# Patient Record
Sex: Female | Born: 1945 | Race: White | Hispanic: No | Marital: Married | Smoking: Never smoker
Health system: Southern US, Community
[De-identification: ages and names within clinical notes are randomized; demographics above are authoritative.]

## PROBLEM LIST (undated history)

## (undated) DIAGNOSIS — I10 Essential (primary) hypertension: Secondary | ICD-10-CM

## (undated) DIAGNOSIS — J302 Other seasonal allergic rhinitis: Secondary | ICD-10-CM

## (undated) DIAGNOSIS — M5430 Sciatica, unspecified side: Secondary | ICD-10-CM

## (undated) HISTORY — PX: TONSILLECTOMY: SUR1361

---

## 2000-05-18 ENCOUNTER — Encounter: Admission: RE | Admit: 2000-05-18 | Discharge: 2000-05-18 | Payer: Self-pay | Admitting: Otolaryngology

## 2000-05-18 ENCOUNTER — Encounter: Payer: Self-pay | Admitting: Otolaryngology

## 2012-12-14 ENCOUNTER — Ambulatory Visit: Payer: Self-pay | Admitting: Internal Medicine

## 2014-06-08 ENCOUNTER — Ambulatory Visit
Admit: 2014-06-08 | Disposition: A | Discharge status: Home / Self Care | Payer: Self-pay | Attending: Internal Medicine | Admitting: Internal Medicine

## 2015-04-16 ENCOUNTER — Emergency Department: Payer: Medicare Other

## 2015-04-16 ENCOUNTER — Emergency Department
Admission: EM | Admit: 2015-04-16 | Discharge: 2015-04-16 | Disposition: A | Discharge status: Home / Self Care | Payer: Medicare Other | Attending: Emergency Medicine | Admitting: Emergency Medicine

## 2015-04-16 DIAGNOSIS — Y9389 Activity, other specified: Secondary | ICD-10-CM | POA: Insufficient documentation

## 2015-04-16 DIAGNOSIS — I10 Essential (primary) hypertension: Secondary | ICD-10-CM | POA: Insufficient documentation

## 2015-04-16 DIAGNOSIS — Y998 Other external cause status: Secondary | ICD-10-CM | POA: Diagnosis not present

## 2015-04-16 DIAGNOSIS — W010XXA Fall on same level from slipping, tripping and stumbling without subsequent striking against object, initial encounter: Secondary | ICD-10-CM | POA: Diagnosis not present

## 2015-04-16 DIAGNOSIS — S42291A Other displaced fracture of upper end of right humerus, initial encounter for closed fracture: Secondary | ICD-10-CM | POA: Insufficient documentation

## 2015-04-16 DIAGNOSIS — S4991XA Unspecified injury of right shoulder and upper arm, initial encounter: Secondary | ICD-10-CM | POA: Diagnosis present

## 2015-04-16 DIAGNOSIS — Y9289 Other specified places as the place of occurrence of the external cause: Secondary | ICD-10-CM | POA: Diagnosis not present

## 2015-04-16 DIAGNOSIS — S42211A Unspecified displaced fracture of surgical neck of right humerus, initial encounter for closed fracture: Secondary | ICD-10-CM

## 2015-04-16 HISTORY — DX: Essential (primary) hypertension: I10

## 2015-04-16 MED ORDER — HYDROCODONE-ACETAMINOPHEN 5-325 MG PO TABS
1.0000 | ORAL_TABLET | Freq: Once | ORAL | Status: AC
  Administered 2015-04-16: 1 via ORAL
  Filled 2015-04-16: qty 1

## 2015-04-16 MED ORDER — HYDROCODONE-ACETAMINOPHEN 5-325 MG PO TABS: 1.0000 | ORAL_TABLET | Freq: Four times a day (QID) | ORAL | Status: AC | PRN

## 2015-04-16 NOTE — ED Notes (Signed)
Fell 2-3 hours ago, unable to move fingers afterwards.  R arm is in pain.  Pt can move but area is very tender

## 2015-04-16 NOTE — ED Provider Notes (Signed)
[facility] Emergency Department Provider Note  ____________________________________________  Time seen: Approximately 8:10 PM  I have reviewed the triage vital signs and the nursing notes.   HISTORY  Chief Complaint Arm Pain    HPI Katrina English is a 70 y.o. female , NAD, reports to the emergency department with right shoulder and upper arm pain since falling 2-3 hours ago. States she tripped over something on the floor and fell and landed on her right shoulder. Had an episode of pain shooting down into the fingers causing inability to move them but that is since resolved. Notes significant tenderness to the upper arm with inability to raise the arm. Denies any injury or trauma to the right upper extremity in the past. Not taking anything for pain. Denies headache, head injury, LOC, chest pain, shortness of breath, dizziness or visual changes.   Past Medical History  Diagnosis Date  . Hypertension     There are no active problems to display for this patient.   Past Surgical History  Procedure Laterality Date  . Tonsillectomy      Current Outpatient Rx  Name  Route  Sig  Dispense  Refill  . HYDROcodone-acetaminophen (NORCO) 5-325 MG tablet   Oral   Take 1 tablet by mouth every 6 (six) hours as needed for severe pain.   20 tablet   0     Allergies Review of patient's allergies indicates no known allergies.  No family history on file.  Social History Social History  Substance Use Topics  . Smoking status: Never Smoker   . Smokeless tobacco: None  . Alcohol Use: No     Review of Systems  Constitutional: No fatigue. Eyes: No visual changes.  Cardiovascular: No chest pain. Respiratory: No shortness of breath. No wheezing.  Gastrointestinal: No abdominal pain.  No nausea, vomiting.   Musculoskeletal: Positive right shoulder and upper arm pain. Negative for back, neck pain.  Skin: Negative for laceration, open wounds nor rash or  ecchymosis. Neurological: Negative for headaches, focal weakness or numbness or tingling. 10-point ROS otherwise negative.  ____________________________________________   PHYSICAL EXAM:  VITAL SIGNS: ED Triage Vitals  Enc Vitals Group     BP 04/16/15 2002 105/67 mmHg     Pulse Rate 04/16/15 2002 95     Resp --      Temp --      Temp src --      SpO2 04/16/15 2002 100 %     Weight 04/16/15 2002 170 lb (77.111 kg)     Height 04/16/15 2002 5' (1.524 m)     Head Cir --      Peak Flow --      Pain Score 04/16/15 2003 8     Pain Loc --      Pain Edu? --      Excl. in GC? --     Constitutional: Alert and oriented. Well appearing and in no acute distress but in pain. Eyes: Conjunctivae are normal. PERRL.  Head: Atraumatic. Neck: No cervical spine tenderness to palpation. Supple with full range of motion Hematological/Lymphatic/Immunilogical: No cervical lymphadenopathy. Cardiovascular: Good peripheral circulation. Upper extremity pulses 2+ with capillary refill less than 3 seconds Respiratory: Normal respiratory effort without tachypnea or retractions.  Musculoskeletal:  Significant tenderness to palpation over the right proximal humerus. Patient unable to abduct the right upper extremity nor abduct without pain. Full range of motion of right elbow on the wrist, fingers without pain.  Neurologic:  Normal  speech and language. No gross focal neurologic deficits are appreciated.  Skin:  Skin is warm, dry and intact. No rash noted. No ecchymosis. Psychiatric: Mood and affect are normal. Speech and behavior are normal. Patient exhibits appropriate insight and judgement.  ____________________________________________   LABS  None ____________________________________________  EKG  None ____________________________________________  RADIOLOGY I have personally viewed and evaluated these images (plain radiographs) as part of my medical decision making, as well as reviewing the  written report by the radiologist.  Dg Shoulder Right  04/16/2015  CLINICAL DATA:  Right arm pain following a fall today. EXAM: RIGHT SHOULDER - 2+ VIEW COMPARISON:  None. FINDINGS: Comminuted fracture of the right humeral neck with extension into the greater tuberosity. 1/3 shaft width of medial displacement and lateral angulation of the distal fragment. No dislocation. Old, healed right rib fractures. IMPRESSION: Comminuted right humeral neck fracture, as described above. Electronically Signed   By: Beckie Salts M.D.   On: 04/16/2015 20:37   Dg Humerus Right  04/16/2015  CLINICAL DATA:  Initial evaluation for acute trauma, fall. EXAM: RIGHT HUMERUS - 2+ VIEW COMPARISON:  None. FINDINGS: Acute transverse fracture through the neck of the right humerus with slight impaction. Probable extension through the greater tuberosity. Humeral head remains grossly aligned with the glenoid. No soft tissue abnormality. Osteopenia noted. IMPRESSION: Acute transverse fracture through the neck of the right humerus with slight impaction. Electronically Signed   By: Rise Mu M.D.   On: 04/16/2015 20:37    ____________________________________________    PROCEDURES  Procedure(s) performed: None    Medications  HYDROcodone-acetaminophen (NORCO/VICODIN) 5-325 MG per tablet 1 tablet (1 tablet Oral Given 04/16/15 2031)     ____________________________________________   INITIAL IMPRESSION / ASSESSMENT AND PLAN / ED COURSE  Pertinent imaging results that were available during my care of the patient were reviewed by me and considered in my medical decision making (see chart for details).  Patient's diagnosis is consistent with right humeral neck fracture. Patient will be discharged home with prescriptions for Norco 5-325 mg tablets to take 1 tablet every 4-6 hours as needed for pain.. Patient is to follow up with Dr. Hyacinth Meeker in orthopedics within 48 hours. She is aware to call his office tomorrow morning  to schedule an appointment for follow-up.  Patient is given ED precautions to return to the ED for any worsening or new symptoms.    ____________________________________________  FINAL CLINICAL IMPRESSION(S) / ED DIAGNOSES  Final diagnoses:  Fracture of neck of humerus, right, closed, initial encounter      NEW MEDICATIONS STARTED DURING THIS VISIT:  New Prescriptions   HYDROCODONE-ACETAMINOPHEN (NORCO) 5-325 MG TABLET    Take 1 tablet by mouth every 6 (six) hours as needed for severe pain.         Hope Pigeon, PA-C 04/16/15 2122  Myrna Blazer, MD 04/16/15 2228

## 2015-04-16 NOTE — Discharge Instructions (Signed)
Humerus Fracture Treated With Immobilization °The humerus is the large bone in your upper arm. You have a broken (fractured) humerus. These fractures are easily diagnosed with X-rays. °TREATMENT  °Simple fractures which will heal without disability are treated with simple immobilization. Immobilization means you will wear a cast, splint, or sling. You have a fracture which will do well with immobilization. The fracture will heal well simply by being held in a good position until it is stable enough to begin range of motion exercises. Do not take part in activities which would further injure your arm.  °HOME CARE INSTRUCTIONS  °· Put ice on the injured area. °¨ Put ice in a plastic bag. °¨ Place a towel between your skin and the bag. °¨ Leave the ice on for 15-20 minutes, 03-04 times a day. °· If you have a cast: °¨ Do not scratch the skin under the cast using sharp or pointed objects. °¨ Check the skin around the cast every day. You may put lotion on any red or sore areas. °¨ Keep your cast dry and clean. °· If you have a splint: °¨ Wear the splint as directed. °¨ Keep your splint dry and clean. °¨ You may loosen the elastic around the splint if your fingers become numb, tingle, or turn cold or blue. °· If you have a sling: °¨ Wear the sling as directed. °· Do not put pressure on any part of your cast or splint until it is fully hardened. °· Your cast or splint can be protected during bathing with a plastic bag. Do not lower the cast or splint into water. °· Only take over-the-counter or prescription medicines for pain, discomfort, or fever as directed by your caregiver. °· Do range of motion exercises as instructed by your caregiver. °· Follow up as directed by your caregiver. This is very important in order to avoid permanent injury or disability and chronic pain. °SEEK IMMEDIATE MEDICAL CARE IF:  °· Your skin or nails in the injured arm turn blue or gray. °· Your arm feels cold or numb. °· You develop severe pain  in the injured arm. °· You are having problems with the medicines you were given. °MAKE SURE YOU:  °· Understand these instructions. °· Will watch your condition. °· Will get help right away if you are not doing well or get worse. °  °This information is not intended to replace advice given to you by your health care provider. Make sure you discuss any questions you have with your health care provider. °  °Document Released: 06/01/2000 Document Revised: 03/16/2014 Document Reviewed: 07/18/2014 °Elsevier Interactive Patient Education ©2016 [employer] ° °

## 2015-06-12 ENCOUNTER — Other Ambulatory Visit: Payer: Self-pay | Admitting: Internal Medicine

## 2015-06-12 DIAGNOSIS — M5416 Radiculopathy, lumbar region: Secondary | ICD-10-CM

## 2015-06-29 ENCOUNTER — Emergency Department
Admission: EM | Admit: 2015-06-29 | Discharge: 2015-06-29 | Disposition: A | Discharge status: Home / Self Care | Payer: Medicare Other | Attending: Emergency Medicine | Admitting: Emergency Medicine

## 2015-06-29 ENCOUNTER — Encounter: Payer: Self-pay | Admitting: Urgent Care

## 2015-06-29 ENCOUNTER — Emergency Department: Payer: Medicare Other

## 2015-06-29 DIAGNOSIS — J302 Other seasonal allergic rhinitis: Secondary | ICD-10-CM

## 2015-06-29 DIAGNOSIS — Z79899 Other long term (current) drug therapy: Secondary | ICD-10-CM | POA: Insufficient documentation

## 2015-06-29 DIAGNOSIS — I10 Essential (primary) hypertension: Secondary | ICD-10-CM | POA: Diagnosis not present

## 2015-06-29 DIAGNOSIS — J069 Acute upper respiratory infection, unspecified: Secondary | ICD-10-CM

## 2015-06-29 DIAGNOSIS — R0602 Shortness of breath: Secondary | ICD-10-CM | POA: Diagnosis present

## 2015-06-29 HISTORY — DX: Other seasonal allergic rhinitis: J30.2

## 2015-06-29 HISTORY — DX: Sciatica, unspecified side: M54.30

## 2015-06-29 LAB — CBC
HEMATOCRIT: 37.8 % (ref 35.0–47.0)
Hemoglobin: 12.9 g/dL (ref 12.0–16.0)
MCH: 33 pg (ref 26.0–34.0)
MCHC: 34.2 g/dL (ref 32.0–36.0)
MCV: 96.6 fL (ref 80.0–100.0)
PLATELETS: 271 10*3/uL (ref 150–440)
RBC: 3.92 MIL/uL (ref 3.80–5.20)
RDW: 13.8 % (ref 11.5–14.5)
WBC: 8.5 10*3/uL (ref 3.6–11.0)

## 2015-06-29 LAB — BASIC METABOLIC PANEL
Anion gap: 9 (ref 5–15)
BUN: 21 mg/dL — AB (ref 6–20)
CALCIUM: 9.6 mg/dL (ref 8.9–10.3)
CHLORIDE: 103 mmol/L (ref 101–111)
CO2: 23 mmol/L (ref 22–32)
CREATININE: 0.82 mg/dL (ref 0.44–1.00)
GFR calc Af Amer: >60 mL/min (ref >60)
GFR calc non Af Amer: >60 mL/min (ref >60)
Glucose, Bld: 125 mg/dL — ABNORMAL HIGH (ref 65–99)
Potassium: 3.5 mmol/L (ref 3.5–5.1)
SODIUM: 135 mmol/L (ref 135–145)

## 2015-06-29 LAB — TROPONIN I

## 2015-06-29 MED ORDER — PREDNISONE 20 MG PO TABS: 40.0000 mg | ORAL_TABLET | Freq: Every day | ORAL | Status: AC

## 2015-06-29 MED ORDER — PREDNISONE 20 MG PO TABS
40.0000 mg | ORAL_TABLET | Freq: Once | ORAL | Status: AC
  Administered 2015-06-29: 40 mg via ORAL
  Filled 2015-06-29: qty 2

## 2015-06-29 MED ORDER — ALBUTEROL SULFATE HFA 108 (90 BASE) MCG/ACT IN AERS: 2.0000 | INHALATION_SPRAY | Freq: Four times a day (QID) | RESPIRATORY_TRACT | Status: AC | PRN

## 2015-06-29 MED ORDER — IPRATROPIUM-ALBUTEROL 0.5-2.5 (3) MG/3ML IN SOLN
3.0000 mL | Freq: Once | RESPIRATORY_TRACT | Status: AC
  Administered 2015-06-29: 3 mL via RESPIRATORY_TRACT
  Filled 2015-06-29: qty 3

## 2015-06-29 NOTE — ED Provider Notes (Signed)
[facility] Emergency Department Provider Note  ____________________________________________  Time seen: Approximately 7:49 AM  I have reviewed the triage vital signs and the nursing notes.   HISTORY  Chief Complaint Shortness of Breath    HPI Katrina English is a 70 y.o. female and presents for evaluation of cough, sinus congestion and a scratchy throat: On for about a week  Patient reports that she thought she strained develop some "allergies" and that her husband has been sick with a "cold". His symptoms have improved but she is continued to have runny nose, occasional yellow drainage, slight scratchy throat, and also frequent dry cough. She denies feeling short of breath, but this morning she felt very congested as though she was having trouble breathing especially through her nose. She took a Sudafed tablet at home, and at this time she reports her symptoms are much better and she no longer feels short of breath or swollen around the nose and congested.  She relates she had a flu shot this year, her symptoms started several days ago. She has not had a known fever. She denies any hives itching and swelling of the lips tongue or throat.   No chest pain. She hasn't seen her doctor recently and is having a MRI of her hip done in about a week.  She does not carry a history of COPD. She's never been a smoker.  Past Medical History  Diagnosis Date  . Hypertension   . Seasonal allergies   . Sciatica     There are no active problems to display for this patient.   Past Surgical History  Procedure Laterality Date  . Tonsillectomy      Current Outpatient Rx  Name  Route  Sig  Dispense  Refill  . albuterol (PROVENTIL HFA;VENTOLIN HFA) 108 (90 Base) MCG/ACT inhaler   Inhalation   Inhale 2 puffs into the lungs every 6 (six) hours as needed for wheezing or shortness of breath.   1 Inhaler   2   . HYDROcodone-acetaminophen (NORCO) 5-325 MG tablet    Oral   Take 1 tablet by mouth every 6 (six) hours as needed for severe pain.   20 tablet   0   . predniSONE (DELTASONE) 20 MG tablet   Oral   Take 2 tablets (40 mg total) by mouth daily.   10 tablet   0     Allergies Review of patient's allergies indicates no known allergies.  No family history on file.  Social History Social History  Substance Use Topics  . Smoking status: Never Smoker   . Smokeless tobacco: None  . Alcohol Use: No    Review of Systems Constitutional: No fever/chills. Eyes: No visual changes. ENT: Scratchy but not sore. No trouble swallowing. Cardiovascular: Denies chest pain. Respiratory:See history of present illness Gastrointestinal: No abdominal pain.  No nausea, no vomiting.  No diarrhea.  No constipation. Genitourinary: Negative for dysuria. Musculoskeletal: Negative for back pain. Skin: Negative for rash. Neurological: Negative for headaches, focal weakness or numbness.  10-point ROS otherwise negative.  ____________________________________________   PHYSICAL EXAM:  VITAL SIGNS: ED Triage Vitals  Enc Vitals Group     BP 06/29/15 0449 159/82 mmHg     Pulse Rate 06/29/15 0449 95     Resp 06/29/15 0742 10     Temp 06/29/15 0449 98 F (36.7 C)     Temp Source 06/29/15 0449 Oral     SpO2 06/29/15 0449 100 %  Weight 06/29/15 0446 176 lb (79.833 kg)     Height 06/29/15 0446 5' (1.524 m)     Head Cir --      Peak Flow --      Pain Score 06/29/15 0447 0     Pain Loc --      Pain Edu? --      Excl. in GC? --    Constitutional: Alert and oriented. Well appearing and in no acute distress.Patient and her husband both very pleasant. Eyes: Conjunctivae are normal. PERRL. EOMI. Head: Atraumatic. Nose:  Mild clear rhinorrhea. Mouth/Throat: Mucous membranes are moist.  Oropharynx widely patent without any edema, some very minimal injection of the posterior oropharynx. Neck: No stridor.   Cardiovascular: Normal rate, regular rhythm.  Grossly normal heart sounds.  Good peripheral circulation. Respiratory: Normal respiratory effort.  No retractions. Lungs CTAB. Speaking in full and clear sentences. Gastrointestinal: Soft and nontender. No distention.  Musculoskeletal: No lower extremity tenderness nor edema.  No joint effusions. Neurologic:  Normal speech and language. No gross focal neurologic deficits are appreciated.Skin:  Skin is warm, dry and intact. No rash noted. Psychiatric: Mood and affect are normal. Speech and behavior are normal. Patient and her husband both very pleasant and amicable.  ____________________________________________   LABS (all labs ordered are listed, but only abnormal results are displayed)  Labs Reviewed  BASIC METABOLIC PANEL - Abnormal; Notable for the following:    Glucose, Bld 125 (*)    BUN 21 (*)    All other components within normal limits  CBC  TROPONIN I   ____________________________________________  EKG  Reviewed and interpreted by me at 7 AM EKG time 4:45 AM Ventricular rate 90 116 QRS 86 QTc 440 Normal sinus rhythm, no evidence of acute ST elevation MI or ischemic change. ____________________________________________  RADIOLOGY  DG Chest 2 View (Final result) Result time: 06/29/15 05:39:45   Final result by Rad Results In Interface (06/29/15 05:39:45)   Narrative:   CLINICAL DATA: Worsening shortness of breath and cough since yesterday. Congestion for 4 days.  EXAM: CHEST 2 VIEW  COMPARISON: None.  FINDINGS: Fracture the right proximal humerus. See additional views of right shoulder 04/16/2015. Pulmonary hyperinflation. Normal heart size and pulmonary vascularity. No focal airspace disease or consolidation in the lungs. No blunting of costophrenic angles. No pneumothorax. Mediastinal contours appear intact.  IMPRESSION: No evidence of active pulmonary disease.   Electronically Signed By: Burman Nieves M.D. On: 06/29/2015 05:39     ____________________________________________   PROCEDURES  Procedure(s) performed: None  Critical Care performed: No  ____________________________________________   INITIAL IMPRESSION / ASSESSMENT AND PLAN / ED COURSE  Pertinent labs & imaging results that were available during my care of the patient were reviewed by me and considered in my medical decision making (see chart for details).  Patient presents for evaluation of cough, coryza, and feeling congested for about a week. She felt very congested this morning and as though she was gasping for air, but it sounds like she had severe sinus/nasal congestion at that time which has been relieved with Sudafed. Her naris and oropharynx are widely patent and her respirations normal. There is no evidence of edema or airway compromise.  She does not complain of any cardiac symptoms. Her EKG is reassuring, labwork reassuring. She is afebrile here. In the setting of influenza season is possible this could be related, however she does not have headache typical myalgias, nurses now ongoing for several days so we'll not treat for it.  I suspect most likely she is experiencing upper respiratory infection, possibly some mild bronchitis and possibly association with seasonal allergies.  She is quite stable, no evidence to suggest a need for further workup such as CT scan. She does not complain of any pleuritic pain, suddenly does not have any risk factors for pulmonary embolism is not complaining of any chest pain. Her oxygen saturation is normal and her work of breathing normal at this time.  Return precautions and treatment recommendations and follow-up discussed with the patient who is agreeable with the plan.  ____________________________________________   FINAL CLINICAL IMPRESSION(S) / ED DIAGNOSES  Final diagnoses:  Seasonal allergic rhinitis  Upper respiratory infection      Sharyn CreamerMark Quale, MD 06/29/15 [phone]

## 2015-06-29 NOTE — ED Notes (Signed)
Pt c/o SOB, sinus/chest congestion, productive cough with thick yellow sputum, nasal drainage (yellow), sore throat, ear pain/popping, and nausea X 1 week. Pt denies abdominal pain, vomiting, diarrhea.

## 2015-06-29 NOTE — ED Notes (Addendum)
Patient presents with c/o worsening SOB and cough since yesterday. (+) congestion x 3-4 days. Patient woke up this morning "gasping for air"; (+) orthopnea.

## 2015-06-29 NOTE — Discharge Instructions (Signed)
We believe that your symptoms are caused today by upper respiratory infection, and possibly bronchitis.  Please take the prescribed medications and any medications that you have at home already prescribed for you.  Follow up with your doctor as recommended.  If you develop any new or worsening symptoms, including but not limited to fever, persistent vomiting, worsening shortness of breath, or other symptoms that concern you, please return to the Emergency Department immediately.

## 2015-07-04 ENCOUNTER — Ambulatory Visit: Payer: Medicare Other

## 2015-07-18 ENCOUNTER — Ambulatory Visit
Admission: RE | Admit: 2015-07-18 | Discharge: 2015-07-18 | Disposition: A | Discharge status: Home / Self Care | Payer: Medicare Other | Source: Ambulatory Visit | Attending: Internal Medicine | Admitting: Internal Medicine

## 2015-07-18 DIAGNOSIS — M4806 Spinal stenosis, lumbar region: Secondary | ICD-10-CM | POA: Diagnosis not present

## 2015-07-18 DIAGNOSIS — M5416 Radiculopathy, lumbar region: Secondary | ICD-10-CM | POA: Insufficient documentation

## 2015-07-18 DIAGNOSIS — M4856XA Collapsed vertebra, not elsewhere classified, lumbar region, initial encounter for fracture: Secondary | ICD-10-CM | POA: Insufficient documentation

## 2015-07-18 DIAGNOSIS — M5136 Other intervertebral disc degeneration, lumbar region: Secondary | ICD-10-CM | POA: Insufficient documentation

## 2015-07-18 MED ORDER — GADOBENATE DIMEGLUMINE 529 MG/ML IV SOLN
20.0000 mL | Freq: Once | INTRAVENOUS | Status: AC | PRN
  Administered 2015-07-18: 16 mL via INTRAVENOUS

## 2015-09-11 ENCOUNTER — Other Ambulatory Visit: Payer: Self-pay | Admitting: Internal Medicine

## 2015-09-11 DIAGNOSIS — Z1231 Encounter for screening mammogram for malignant neoplasm of breast: Secondary | ICD-10-CM

## 2015-09-24 ENCOUNTER — Ambulatory Visit: Payer: Medicare Other

## 2015-10-15 ENCOUNTER — Ambulatory Visit
Admission: RE | Admit: 2015-10-15 | Discharge: 2015-10-15 | Disposition: A | Discharge status: Home / Self Care | Payer: Medicare Other | Source: Ambulatory Visit | Attending: Internal Medicine | Admitting: Internal Medicine

## 2015-10-15 ENCOUNTER — Other Ambulatory Visit: Payer: Self-pay | Admitting: Internal Medicine

## 2015-10-15 DIAGNOSIS — Z1231 Encounter for screening mammogram for malignant neoplasm of breast: Secondary | ICD-10-CM | POA: Insufficient documentation

## 2016-10-22 ENCOUNTER — Other Ambulatory Visit: Payer: Self-pay | Admitting: Internal Medicine

## 2016-10-22 DIAGNOSIS — Z1231 Encounter for screening mammogram for malignant neoplasm of breast: Secondary | ICD-10-CM

## 2016-11-17 ENCOUNTER — Ambulatory Visit
Admission: RE | Admit: 2016-11-17 | Discharge: 2016-11-17 | Disposition: A | Discharge status: Home / Self Care | Payer: Medicare Other | Source: Ambulatory Visit | Attending: Internal Medicine | Admitting: Internal Medicine

## 2016-11-17 DIAGNOSIS — Z1231 Encounter for screening mammogram for malignant neoplasm of breast: Secondary | ICD-10-CM | POA: Insufficient documentation

## 2017-12-20 ENCOUNTER — Other Ambulatory Visit: Payer: Self-pay | Admitting: Internal Medicine

## 2017-12-20 DIAGNOSIS — Z1231 Encounter for screening mammogram for malignant neoplasm of breast: Secondary | ICD-10-CM

## 2018-01-12 ENCOUNTER — Ambulatory Visit
Admission: RE | Admit: 2018-01-12 | Discharge: 2018-01-12 | Disposition: A | Discharge status: Home / Self Care | Payer: Medicare Other | Source: Ambulatory Visit | Attending: Internal Medicine | Admitting: Internal Medicine

## 2018-01-12 DIAGNOSIS — Z1231 Encounter for screening mammogram for malignant neoplasm of breast: Secondary | ICD-10-CM | POA: Diagnosis present

## 2020-02-12 ENCOUNTER — Other Ambulatory Visit: Payer: Self-pay | Admitting: Internal Medicine

## 2020-02-12 DIAGNOSIS — Z1231 Encounter for screening mammogram for malignant neoplasm of breast: Secondary | ICD-10-CM

## 2020-04-30 ENCOUNTER — Ambulatory Visit
Admission: RE | Admit: 2020-04-30 | Discharge: 2020-04-30 | Disposition: A | Discharge status: Home / Self Care | Payer: Medicare Other | Source: Ambulatory Visit | Attending: Internal Medicine | Admitting: Internal Medicine

## 2020-04-30 ENCOUNTER — Other Ambulatory Visit: Payer: Self-pay

## 2020-04-30 DIAGNOSIS — Z1231 Encounter for screening mammogram for malignant neoplasm of breast: Secondary | ICD-10-CM | POA: Diagnosis present

## 2021-09-10 ENCOUNTER — Other Ambulatory Visit: Payer: Self-pay | Admitting: Internal Medicine

## 2021-09-10 DIAGNOSIS — Z1231 Encounter for screening mammogram for malignant neoplasm of breast: Secondary | ICD-10-CM

## 2022-08-26 ENCOUNTER — Other Ambulatory Visit: Payer: Self-pay | Admitting: Internal Medicine

## 2022-08-26 DIAGNOSIS — Z1231 Encounter for screening mammogram for malignant neoplasm of breast: Secondary | ICD-10-CM

## 2022-12-19 IMAGING — MG MM DIGITAL SCREENING BILAT W/ TOMO AND CAD
8 series · 8 of 24 positions shown · non-contrast
Comparison: Previous exam(s).

CLINICAL DATA: Screening.

EXAM:
DIGITAL SCREENING BILATERAL MAMMOGRAM WITH TOMOSYNTHESIS AND CAD
TECHNIQUE: Bilateral screening digital craniocaudal and mediolateral oblique
mammograms were obtained. Bilateral screening digital breast
tomosynthesis was performed. The images were evaluated with
computer-aided detection.

[R MLO synth-2D]
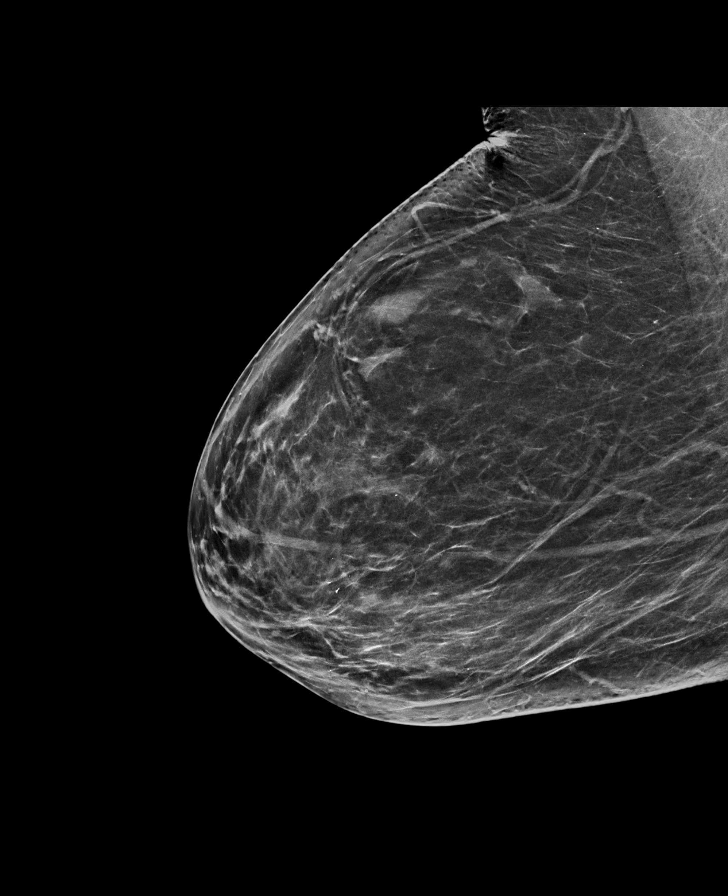

[L CC synth-2D]
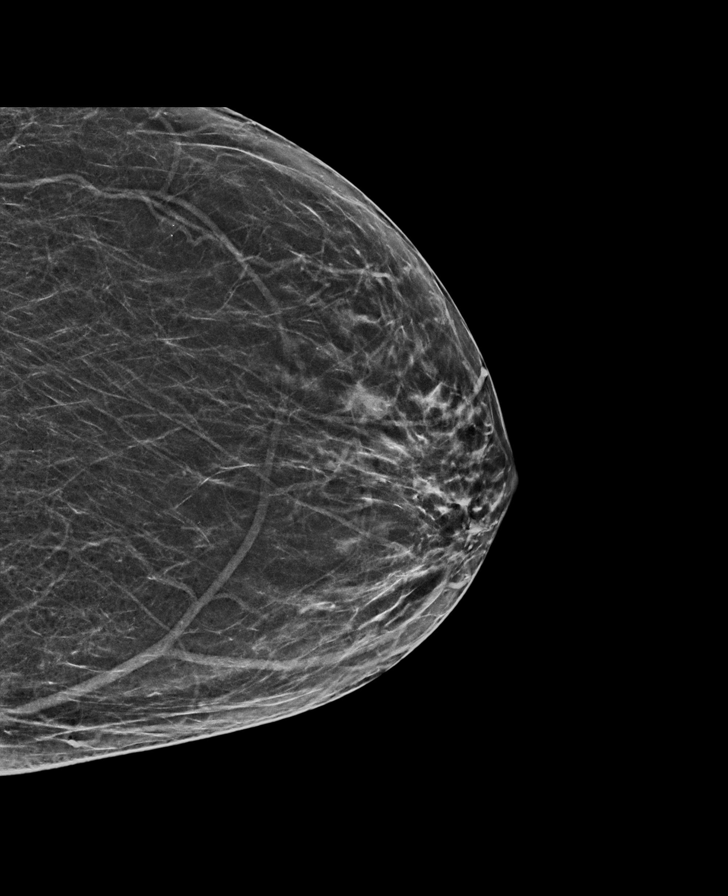

[L MLO synth-2D]
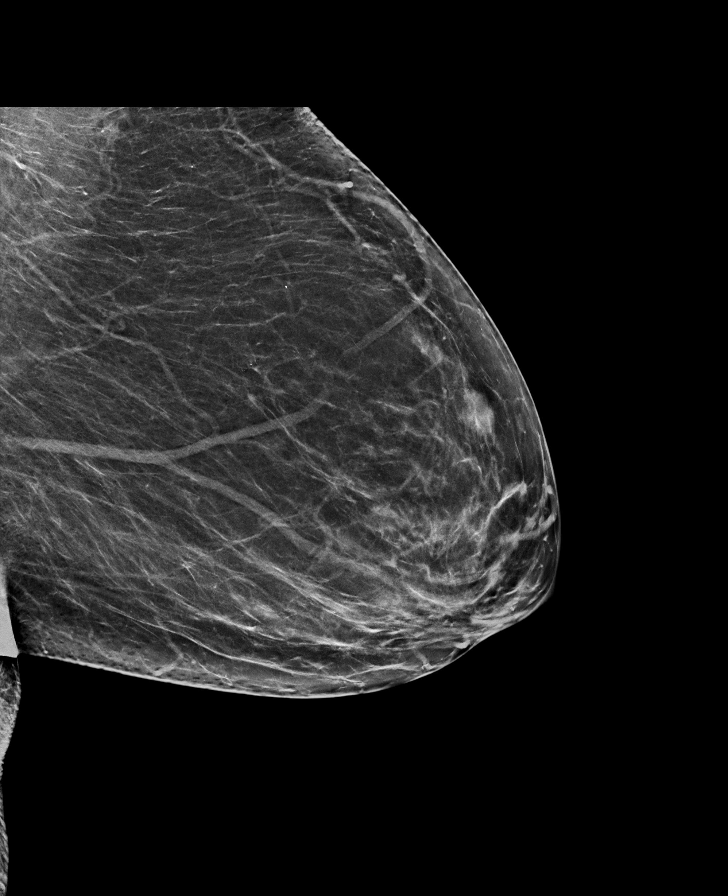

[R CC synth-2D]
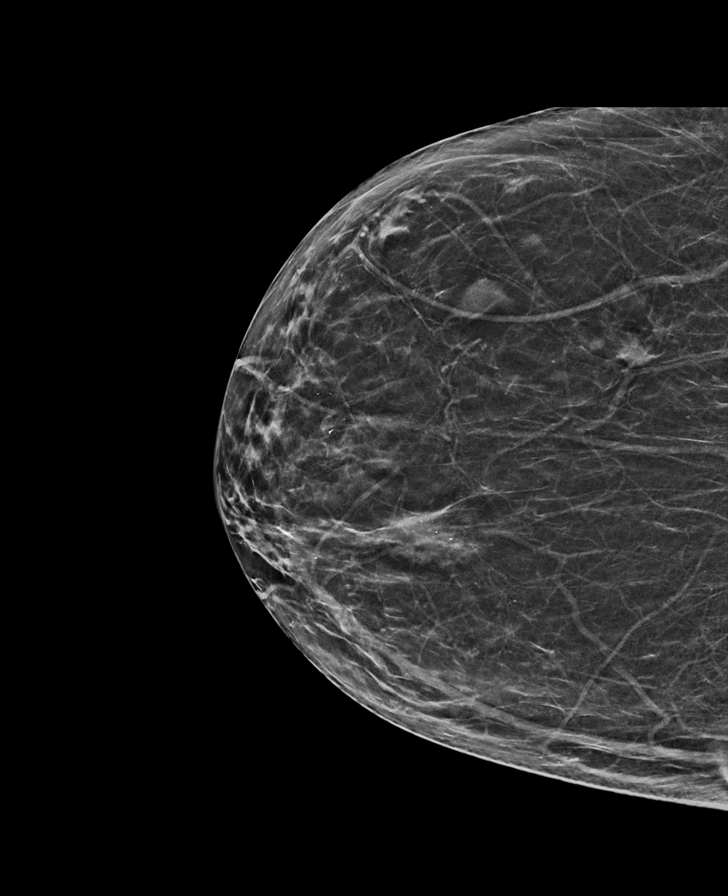

[L MLO tomo · tomo slice 35/69.0]
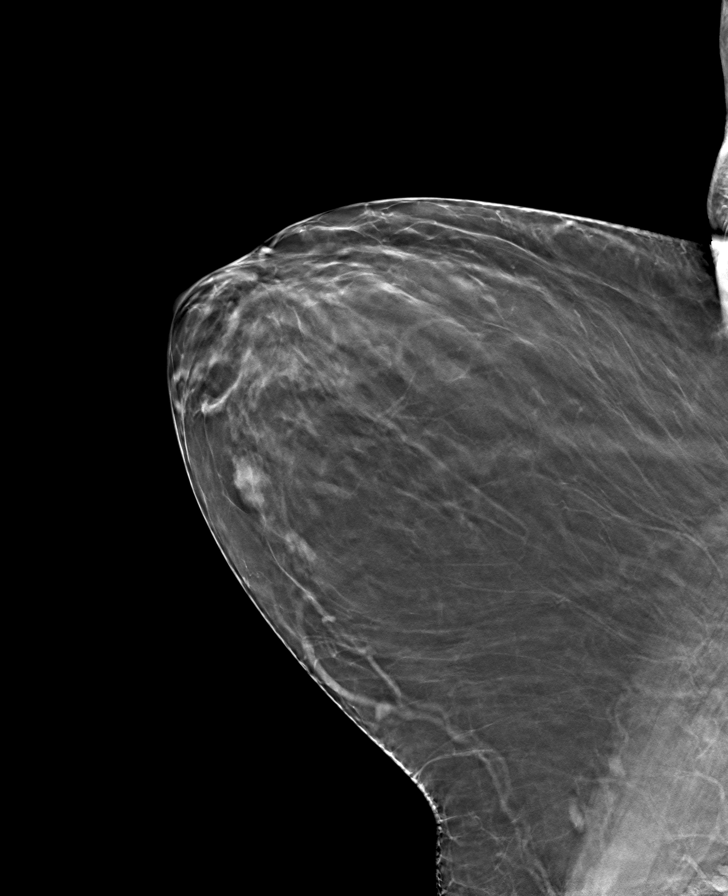

[R CC tomo · tomo slice 29/57.0]
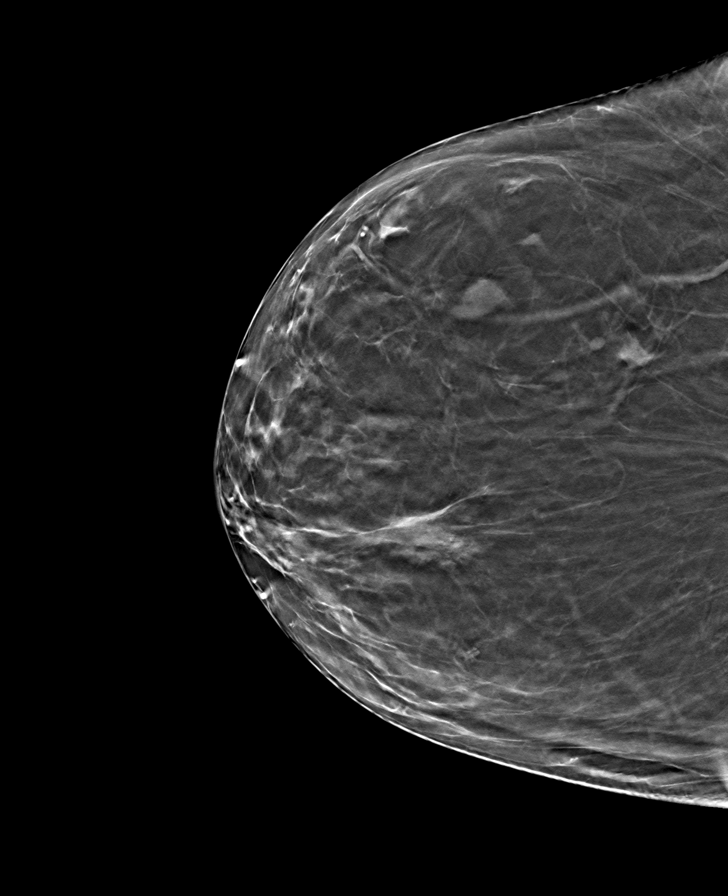

[L CC tomo · tomo slice 31/61.0]
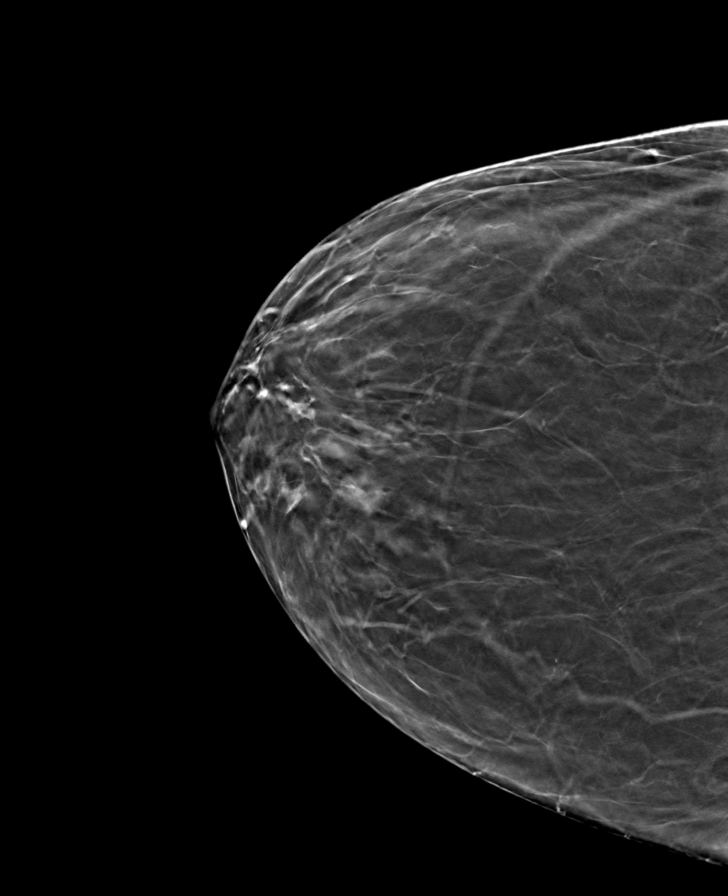

[R MLO tomo · tomo slice 37/72.0]
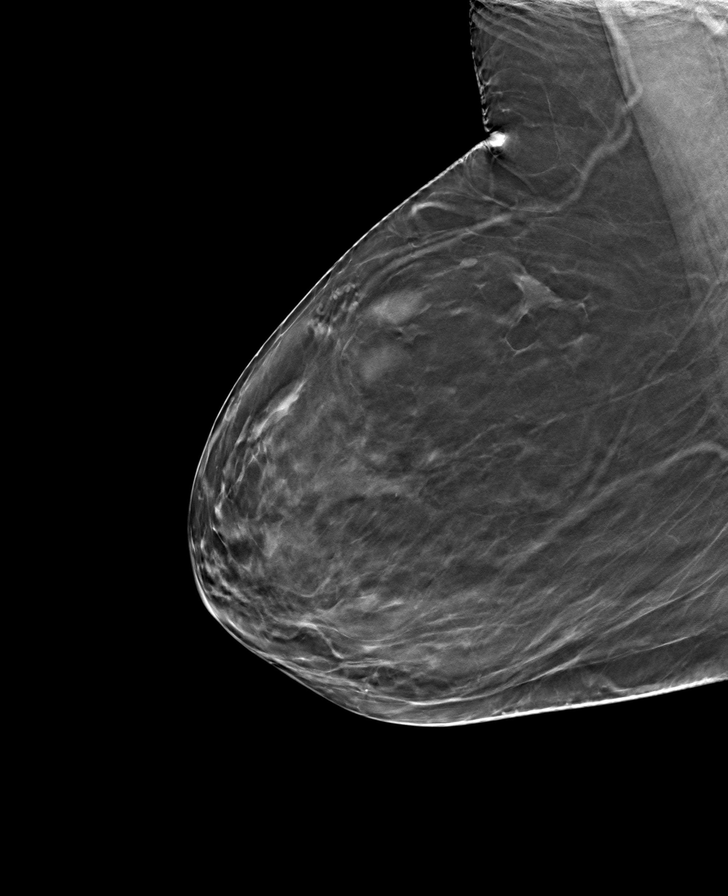

[8 of 24 positions shown; findings below may reference images not displayed]

ACR Breast Density Category b: There are scattered areas of
fibroglandular density.
FINDINGS: There are no findings suspicious for malignancy.
IMPRESSION: No mammographic evidence of malignancy. A result letter of this
screening mammogram will be mailed directly to the patient.

RECOMMENDATION:
Screening mammogram in one year. (Code:51-O-LD2)

BI-RADS CATEGORY  1: Negative.

## 2023-03-11 ENCOUNTER — Ambulatory Visit
Admission: RE | Admit: 2023-03-11 | Discharge: 2023-03-11 | Disposition: A | Discharge status: Home / Self Care | Payer: Medicare Other | Source: Ambulatory Visit | Attending: Internal Medicine | Admitting: Internal Medicine

## 2023-03-11 DIAGNOSIS — Z1231 Encounter for screening mammogram for malignant neoplasm of breast: Secondary | ICD-10-CM | POA: Diagnosis present

## 2023-03-18 ENCOUNTER — Other Ambulatory Visit: Payer: Self-pay | Admitting: Internal Medicine

## 2023-03-18 DIAGNOSIS — R928 Other abnormal and inconclusive findings on diagnostic imaging of breast: Secondary | ICD-10-CM

## 2023-04-23 ENCOUNTER — Ambulatory Visit
Admission: RE | Admit: 2023-04-23 | Discharge: 2023-04-23 | Disposition: A | Discharge status: Home / Self Care | Payer: Medicare Other | Source: Ambulatory Visit | Attending: Internal Medicine | Admitting: Internal Medicine

## 2023-04-23 DIAGNOSIS — R928 Other abnormal and inconclusive findings on diagnostic imaging of breast: Secondary | ICD-10-CM | POA: Insufficient documentation

## 2023-09-07 ENCOUNTER — Encounter: Payer: Self-pay | Admitting: Internal Medicine

## 2023-09-08 ENCOUNTER — Other Ambulatory Visit: Payer: Self-pay | Admitting: Internal Medicine

## 2023-09-08 DIAGNOSIS — R921 Mammographic calcification found on diagnostic imaging of breast: Secondary | ICD-10-CM
